# Patient Record
Sex: Male | Born: 1990 | Race: White | Hispanic: No | Marital: Single | Smoking: Never smoker
Health system: Southern US, Community
[De-identification: ages and names within clinical notes are randomized; demographics above are authoritative.]

---
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
## 2012-04-19 ENCOUNTER — Ambulatory Visit (INDEPENDENT_AMBULATORY_CARE_PROVIDER_SITE_OTHER): Payer: BC Managed Care – PPO | Admitting: Sports Medicine

## 2012-04-19 VITALS — BP 120/80 | Ht 73.0 in | Wt 185.0 lb

## 2012-04-19 DIAGNOSIS — M25539 Pain in unspecified wrist: Secondary | ICD-10-CM

## 2012-04-19 DIAGNOSIS — M25532 Pain in left wrist: Secondary | ICD-10-CM | POA: Insufficient documentation

## 2012-04-19 NOTE — Patient Instructions (Addendum)
NEW APPT WITH DR Mina Marble IS FOR THURS OCT 24TH AT 345P [phone]

## 2012-04-19 NOTE — Progress Notes (Signed)
Jason Odonnell is a 21 y.o. male who presents to [facility] today for left wrist pain.  Patient had a fractured Pisiform in March of 2013, during his lacrosse season.  He waited to the end of the season to have surgery. Dr. Mina Marble performed a Pisiform removal July 1. He had a MRI prior to the surgery. Patient notes continued pain in the ulnar aspect of his wrist and on the proximal ulnar side of his forearm.  His pain is worse with lifting weights and grip strength. Additionally he notes pain with ulnar deviation of the wrist.  No wrist clicking or locking.  He denies any numbness tingling radiating pain or neck pain.  He has an appointment with Dr. Mina Marble on Thanksgiving.  He wonders if there's anything else he can do in the meantime.  He plans to transfer to [employer] where he will play lacrosse next semester.     PMH reviewed. Otherwise healthy History  Substance Use Topics  . Smoking status: Not on file  . Smokeless tobacco: Not on file  . Alcohol Use: Not on file   ROS as above otherwise neg   Exam:  BP 120/80  Ht 6\' 1"  (1.854 m)  Wt 185 lb (83.915 kg)  BMI 24.41 kg/m2 Gen: Well NAD MSK: Left wrist normal-appearing well-healed scar in the ulnar aspect of his volar wrist.  Nontender.  Normal range of motion and strength. Pain over the mid shaft of the proximal ulna with ulnar deviation.  Specifically pain with firing of the extensor carpi ulnaris.    Normal distal hand motion strength sensation and capillary refill. Pulses are intact.    Grip strength is intact.

## 2012-04-19 NOTE — Assessment & Plan Note (Addendum)
New problem Unsure etiology at this point. Question postoperative pain versus irritation of the extensor carpi ulnars, versus TFCC injury.   Plan: Arrange a sooner appointment with Dr. Mina Marble.   Eccentric wrist exercises involving ulnar deviation.  Lacrosse specific wrist strength program. Forearm compressive sleeve.  Followup as needed

## 2013-12-11 ENCOUNTER — Ambulatory Visit (INDEPENDENT_AMBULATORY_CARE_PROVIDER_SITE_OTHER): Payer: BC Managed Care – PPO | Admitting: Sports Medicine

## 2013-12-11 ENCOUNTER — Encounter: Payer: Self-pay | Admitting: Sports Medicine

## 2013-12-11 VITALS — BP 114/68 | Ht 73.0 in | Wt 185.0 lb

## 2013-12-11 DIAGNOSIS — S93629A Sprain of tarsometatarsal ligament of unspecified foot, initial encounter: Secondary | ICD-10-CM | POA: Insufficient documentation

## 2013-12-11 NOTE — Assessment & Plan Note (Signed)
see his plan We will continue him with arch support and arch strapping  Add these to all sports shoes  Recheck if not improved

## 2013-12-11 NOTE — Progress Notes (Signed)
History was provided by the patient.  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     HPI:    Jason Odonnell reports that he has had right foot pain since April. He was playing in his last game for [employer] and experienced an injury when another player stepped on his right foot. He had significant pain at that time lasting for several days. He noted a small amount of swelling and bruising. He was able to bear weight and did not require crutches. Since that time, he has continued to have pain when playing lacrosse. He has pain when he is changing directions. It is a sharp pain that comes in bursts and does not last. The pain is located on the top of his midfoot and extends to the plantar surface. He does not have pain with everyday activities such as walking or weight lifting.   He has not had any lower extremity MSK injuries in the past.    Physical Exam:  BP 114/68  Ht 6\' 1"  (1.854 m)  Wt 185 lb (83.915 kg)  BMI 24.41 kg/m2  Facility age limit for growth percentiles is 20 years. No LMP for male patient.    General:   alert, cooperative and no distress. athletic male     Skin:   normal  Eyes:   sclerae white  Lungs:  clear to auscultation bilaterally  Heart:   regular rate and rhythm, S1, S2 normal, no murmur, click, rub or gallop   MSK:   no obvious deformity of right foot. No pain on palpation of the right foot. Normal gait. No pain on active movements of the foot including flexion, extension, internal and external rotation. No pain on manipulation of the foot by examiner. Does have some pain when asked to stand on one foot and squat (when dropping knee midline, internally and externally).   Neuro:  normal without focal findings and mental status, speech normal, alert and oriented x3    Assessment/Plan:  1. Sprain of tarsometatarsal ligament of foot Patient with sprain of Lisfrank joint of right foot based on clinical history and exam. There is  no joint instability on exam.  Ultrasonographic evaluation consistent with diagnosis. In the 2nd joint space, has a step down of bone consistent with possible healed fracture. This may be cause of persistent pain. 4th joint space is slightly widened. - will give foot wrap to improve stability with exercise - gave arch pad to wear in athletic shoes - will allow to do activity that he is able to tolerate   - Follow-up visit as needed.    Dianara Smullen [place], MD [facility] Pediatric[facility] Resident, PGY1 12/11/2013  Jason Odonnell and reviewed Sterling Big, MD
